# Patient Record
Sex: Male | Born: 1940 | Race: White | Hispanic: No | Marital: Married | Smoking: Never smoker
Health system: Southern US, Community
[De-identification: ages and names within clinical notes are randomized; demographics above are authoritative.]

## PROBLEM LIST (undated history)

## (undated) DIAGNOSIS — I2510 Atherosclerotic heart disease of native coronary artery without angina pectoris: Secondary | ICD-10-CM

## (undated) HISTORY — PX: HIP SURGERY: SHX245

## (undated) HISTORY — PX: AORTIC VALVE REPLACEMENT (AVR)/CORONARY ARTERY BYPASS GRAFTING (CABG): SHX5725

---

## 2015-12-24 ENCOUNTER — Emergency Department (HOSPITAL_COMMUNITY): Payer: Medicare (Managed Care)

## 2015-12-24 ENCOUNTER — Encounter (HOSPITAL_COMMUNITY): Payer: Self-pay | Admitting: Emergency Medicine

## 2015-12-24 DIAGNOSIS — I2510 Atherosclerotic heart disease of native coronary artery without angina pectoris: Secondary | ICD-10-CM | POA: Insufficient documentation

## 2015-12-24 DIAGNOSIS — R0789 Other chest pain: Secondary | ICD-10-CM | POA: Insufficient documentation

## 2015-12-24 DIAGNOSIS — Z951 Presence of aortocoronary bypass graft: Secondary | ICD-10-CM | POA: Diagnosis not present

## 2015-12-24 DIAGNOSIS — M7989 Other specified soft tissue disorders: Secondary | ICD-10-CM | POA: Insufficient documentation

## 2015-12-24 LAB — BASIC METABOLIC PANEL
Anion gap: 7 (ref 5–15)
BUN: 8 mg/dL (ref 6–20)
CALCIUM: 9.1 mg/dL (ref 8.9–10.3)
CHLORIDE: 98 mmol/L — AB (ref 101–111)
CO2: 28 mmol/L (ref 22–32)
CREATININE: 1.09 mg/dL (ref 0.61–1.24)
GFR calc Af Amer: >60 mL/min (ref >60)
GFR calc non Af Amer: >60 mL/min (ref >60)
Glucose, Bld: 112 mg/dL — ABNORMAL HIGH (ref 65–99)
Potassium: 3.7 mmol/L (ref 3.5–5.1)
SODIUM: 133 mmol/L — AB (ref 135–145)

## 2015-12-24 LAB — CBC
HCT: 39.7 % (ref 39.0–52.0)
Hemoglobin: 12.8 g/dL — ABNORMAL LOW (ref 13.0–17.0)
MCH: 29.6 pg (ref 26.0–34.0)
MCHC: 32.2 g/dL (ref 30.0–36.0)
MCV: 91.7 fL (ref 78.0–100.0)
PLATELETS: 182 10*3/uL (ref 150–400)
RBC: 4.33 MIL/uL (ref 4.22–5.81)
RDW: 13.8 % (ref 11.5–15.5)
WBC: 8.2 10*3/uL (ref 4.0–10.5)

## 2015-12-24 LAB — PROTIME-INR
INR: 1.08
Prothrombin Time: 14 seconds (ref 11.4–15.2)

## 2015-12-24 LAB — I-STAT TROPONIN, ED: Troponin i, poc: 0 ng/mL (ref 0.00–0.08)

## 2015-12-24 NOTE — ED Triage Notes (Signed)
Pt. reports intermittent central chest pressure with mild SOB , nausea , lightheaded and diaphoresis onset evening . History of CAD/CABG/Aortic Valve Replacement , [place] at [place] .

## 2015-12-25 ENCOUNTER — Emergency Department (HOSPITAL_COMMUNITY)
Admission: EM | Admit: 2015-12-25 | Discharge: 2015-12-25 | Disposition: A | Discharge status: Home / Self Care | Payer: Medicare (Managed Care) | Attending: Emergency Medicine | Admitting: Emergency Medicine

## 2015-12-25 DIAGNOSIS — R0789 Other chest pain: Secondary | ICD-10-CM

## 2015-12-25 DIAGNOSIS — M25431 Effusion, right wrist: Secondary | ICD-10-CM

## 2015-12-25 DIAGNOSIS — M25531 Pain in right wrist: Secondary | ICD-10-CM

## 2015-12-25 HISTORY — DX: Atherosclerotic heart disease of native coronary artery without angina pectoris: I25.10

## 2015-12-25 LAB — I-STAT TROPONIN, ED: Troponin i, poc: 0 ng/mL (ref 0.00–0.08)

## 2015-12-25 MED ORDER — OXYCODONE-ACETAMINOPHEN 5-325 MG PO TABS
1.0000 | ORAL_TABLET | Freq: Once | ORAL | Status: AC
  Administered 2015-12-25: 1 via ORAL
  Filled 2015-12-25: qty 1

## 2015-12-25 MED ORDER — OXYCODONE-ACETAMINOPHEN 5-325 MG PO TABS: 1.0000 | ORAL_TABLET | ORAL | 0 refills | Status: AC | PRN

## 2015-12-25 MED ORDER — PREDNISONE 50 MG PO TABS: 50.0000 mg | ORAL_TABLET | Freq: Every day | ORAL | 0 refills | Status: AC

## 2015-12-25 MED ORDER — PREDNISONE 20 MG PO TABS
60.0000 mg | ORAL_TABLET | Freq: Once | ORAL | Status: AC
  Administered 2015-12-25: 60 mg via ORAL
  Filled 2015-12-25: qty 3

## 2015-12-25 NOTE — ED Notes (Signed)
Ortho tech at bedside 

## 2015-12-25 NOTE — ED Provider Notes (Signed)
[facility]-EMERGENCY DEPT Provider Note   CSN: [id number] Arrival date & time: 12/24/15  2001  By signing my name below, I, Rosario Adie, attest that this documentation has been prepared under the direction and in the presence of Dione Booze, MD. Electronically Signed: Rosario Adie, ED Scribe. 12/25/15. 1:24 AM.  History   Chief Complaint Chief Complaint  Patient presents with  . Chest Pain    " Chest Pressure"    The history is provided by the patient and the spouse. No language interpreter was used.   HPI Comments: Michael Huff is a 75 y.o. male with a PMHx significant of CAD s/p CABG, who presents to the Emergency Department complaining of intermittent episodes of chest pain described as tightness onset ~6 hours ago. Pt reports that he was driving earlier today when he felt a sudden onset of light-headedness, SOB, and diaphoresis along with his first episode of pain. This episode lasted for ~15 minutes. No pain while in the ED. No precipitating, modifying, or alleviating factors noted. He additionally states that prior to this episode, and intermittently since his episode of pain he has felt a pruritic sensation diffusely across his body that will last for approximately ~15 minutes each time and has a new onset of left wrist swelling/pain this afternoon. He rates his wrist pain as 9/10. Pt is ~7 weeks s/p aortic valve replacement. He is currently taking Plavix and 81mg  Asprin daily. Denies numbness, or any other associated symptoms.   Past Medical History:  Diagnosis Date  . Coronary artery disease    There are no active problems to display for this patient.  Past Surgical History:  Procedure Laterality Date  . AORTIC VALVE REPLACEMENT (AVR)/CORONARY ARTERY BYPASS GRAFTING (CABG)    . HIP SURGERY      Home Medications    Prior to Admission medications   Not on File   Family History No family history on file.  Social History Social History  Substance Use  Topics  . Smoking status: Never Smoker  . Smokeless tobacco: Never Used  . Alcohol use No   Allergies   Review of patient's allergies indicates no known allergies.  Review of Systems Review of Systems  Constitutional: Positive for diaphoresis.  Respiratory: Positive for chest tightness and shortness of breath.   Cardiovascular: Positive for chest pain.  Musculoskeletal: Positive for arthralgias (left wrist).       Positive for left wrist swelling.   Neurological: Positive for light-headedness. Negative for numbness.  All other systems reviewed and are negative.  Physical Exam Updated Vital Signs BP 149/68   Pulse (!) 59   Temp 98 F (36.7 C) (Oral)   Resp 16   Ht 5\' 8"  (1.727 m)   Wt 185 lb (83.9 kg)   SpO2 98%   BMI 28.13 kg/m   Physical Exam  Constitutional: He is oriented to person, place, and time. He appears well-developed and well-nourished.  HENT:  Head: Normocephalic and atraumatic.  Eyes: EOM are normal. Pupils are equal, round, and reactive to light.  Neck: Normal range of motion. Neck supple. No JVD present.  Cardiovascular: Normal rate and regular rhythm.   Murmur heard. 2/6 systolic ejection murmur noted. Prominent aortic valve click on exam.   Pulmonary/Chest: Effort normal and breath sounds normal. He has no wheezes. He has no rales. He exhibits no tenderness.  Sternotomy scar present that is well healed.   Abdominal: Soft. Bowel sounds are normal. He exhibits no distension and no mass. There  is no tenderness.  Musculoskeletal: He exhibits edema and tenderness.  Right wrist w/ mild effusion, moderate TTP, and pain on passive ROM.   Lymphadenopathy:    He has no cervical adenopathy.  Neurological: He is alert and oriented to person, place, and time. No cranial nerve deficit. He exhibits normal muscle tone. Coordination normal.  Skin: Skin is warm and dry. No rash noted.  Psychiatric: He has a normal mood and affect. His behavior is normal. Judgment and  thought content normal.  Nursing note and vitals reviewed.  ED Treatments / Results  DIAGNOSTIC STUDIES: Oxygen Saturation is 98% on RA, normal by my interpretation.   COORDINATION OF CARE: 1:21 AM-Discussed next steps with pt. Pt verbalized understanding and is agreeable with the plan.   Labs (all labs ordered are listed, but only abnormal results are displayed) Labs Reviewed  BASIC METABOLIC PANEL - Abnormal; Notable for the following:       Result Value   Sodium 133 (*)    Chloride 98 (*)    Glucose, Bld 112 (*)    All other components within normal limits  CBC - Abnormal; Notable for the following:    Hemoglobin 12.8 (*)    All other components within normal limits  PROTIME-INR  I-STAT TROPOININ, ED  I-STAT TROPOININ, ED   EKG  EKG Interpretation  Date/Time:  Wednesday December 24 2015 20:07:00 EDT Ventricular Rate:  58 PR Interval:  178 QRS Duration: 102 QT Interval:  490 QTC Calculation: 481 R Axis:   88 Text Interpretation:  Sinus bradycardia Possible Left atrial enlargement Prolonged QT Abnormal ECG No old tracing to compare Confirmed by [facility]  MD, Humna Moorehouse ([id number]) on 12/25/2015 1:05:47 AM      Radiology Dg Chest 2 View  Result Date: 12/24/2015 CLINICAL DATA:  Right upper extremity swelling. Chest pressure. Diaphoresis. Onset today EXAM: CHEST  2 VIEW COMPARISON:  None. FINDINGS: Prior sternotomy and aortic valvuloplasty.  Moderate cardiomegaly. The lungs are clear except for minimal linear basilar scarring or atelectasis. No airspace consolidation. No effusions. Normal pulmonary vasculature. IMPRESSION: Cardiomegaly.  No consolidation.  No effusion.  No evidence of CHF. Electronically Signed   By: Ellery Plunk M.D.   On: 12/24/2015 21:14   Procedures Procedures (including critical care time)  Medications Ordered in ED Medications  predniSONE (DELTASONE) tablet 60 mg (60 mg Oral Given 12/25/15 0138)  oxyCODONE-acetaminophen (PERCOCET/ROXICET) 5-325 MG per  tablet 1 tablet (1 tablet Oral Given 12/25/15 0138)    Initial Impression / Assessment and Plan / ED Course  I have reviewed the triage vital signs and the nursing notes.  Pertinent labs & imaging results that were available during my care of the patient were reviewed by me and considered in my medical decision making (see chart for details).  Clinical Course    Episodes of chest discomfort which could represent angina. He is status post recent bypass surgery. ECG shows no acute changes and initial troponin is negative. Repeat ECG shows no changes. Repeat troponin is pending. Right wrist pain seems likely to be due to gout and he is given dose of prednisone as well as oxycodone have acetaminophen and placed in a wrist splint for comfort. 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 Currently, warfarin is being withheld because of bleeding complications.  Repeat troponin is normal. He is discharged with prescriptions for prednisone and oxycodone have acetaminophen and is referred back to his  primary care physician and cardiologist in [place].  Final Clinical Impressions(s) / ED Diagnoses   Final diagnoses:  Chest discomfort  Pain and swelling of right wrist    New Prescriptions New Prescriptions   OXYCODONE-ACETAMINOPHEN (PERCOCET) 5-325 MG TABLET    Take 1 tablet by mouth every 4 (four) hours as needed for moderate pain.   PREDNISONE (DELTASONE) 50 MG TABLET    Take 1 tablet (50 mg total) by mouth daily.   I personally performed the services described in this documentation, which was scribed in my presence. The recorded information has been reviewed and is accurate.      Dione Boozeavid Journey [place], MD 12/25/15 [phone]

## 2015-12-25 NOTE — ED Notes (Signed)
Glick MD at bedside  

## 2015-12-25 NOTE — ED Notes (Signed)
RN called ortho tech 

## 2015-12-25 NOTE — Progress Notes (Signed)
Orthopedic Tech Progress Note Patient Details:  Michael BumpMelvin Huff 1940/05/17 [id number]  Ortho Devices Type of Ortho Device: Velcro wrist splint Ortho Device/Splint Location: rue Ortho Device/Splint Interventions: Ordered, Application   Trinna PostMartinez, Karmel Patricelli J 12/25/2015, 1:39 AM

## 2017-04-09 IMAGING — DX DG CHEST 2V
2 series · 2 of 2 positions shown · non-contrast
Comparison: None.

CLINICAL DATA: Right upper extremity swelling. Chest pressure.
Diaphoresis. Onset today

EXAM:
CHEST  2 VIEW

[chest pa]
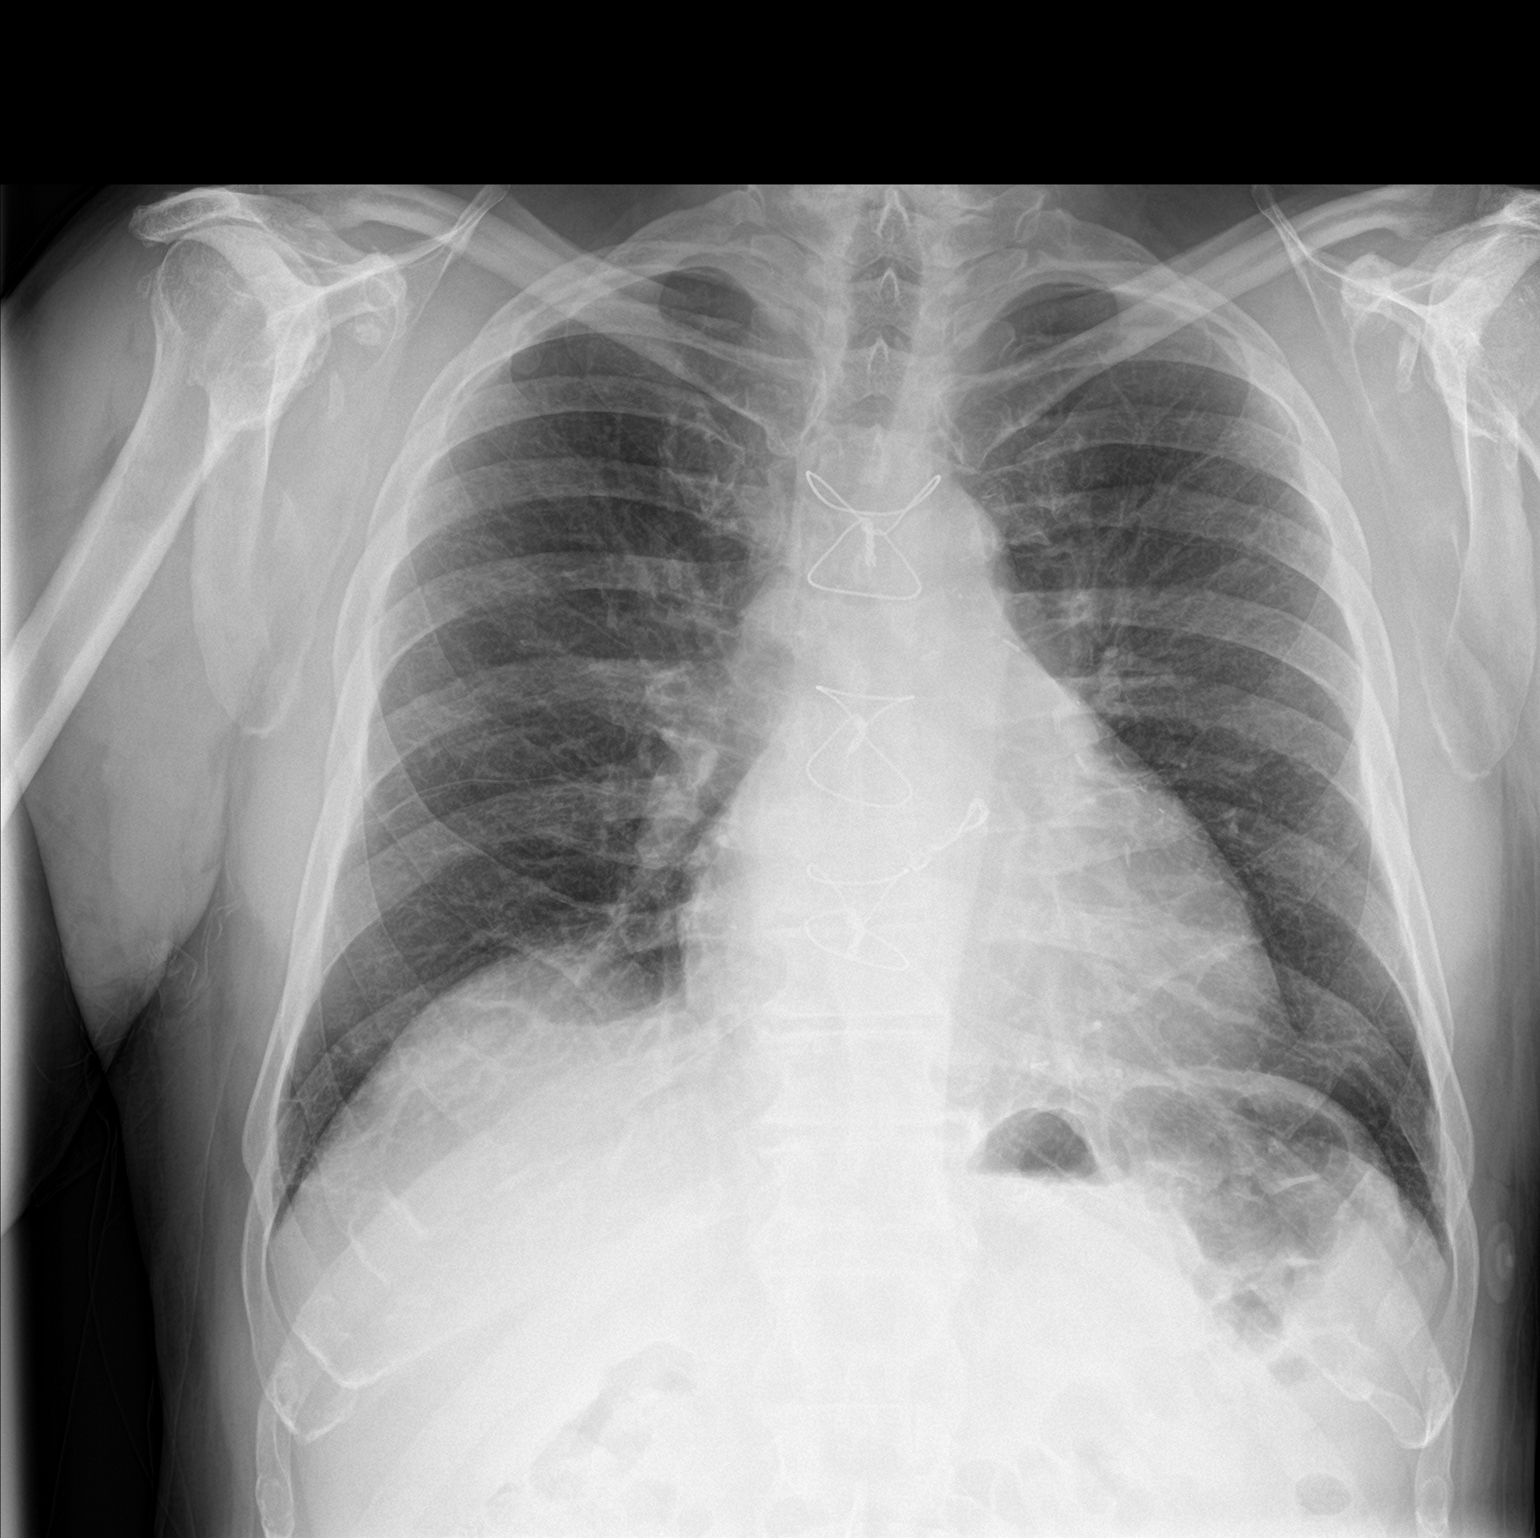

[chest lat]
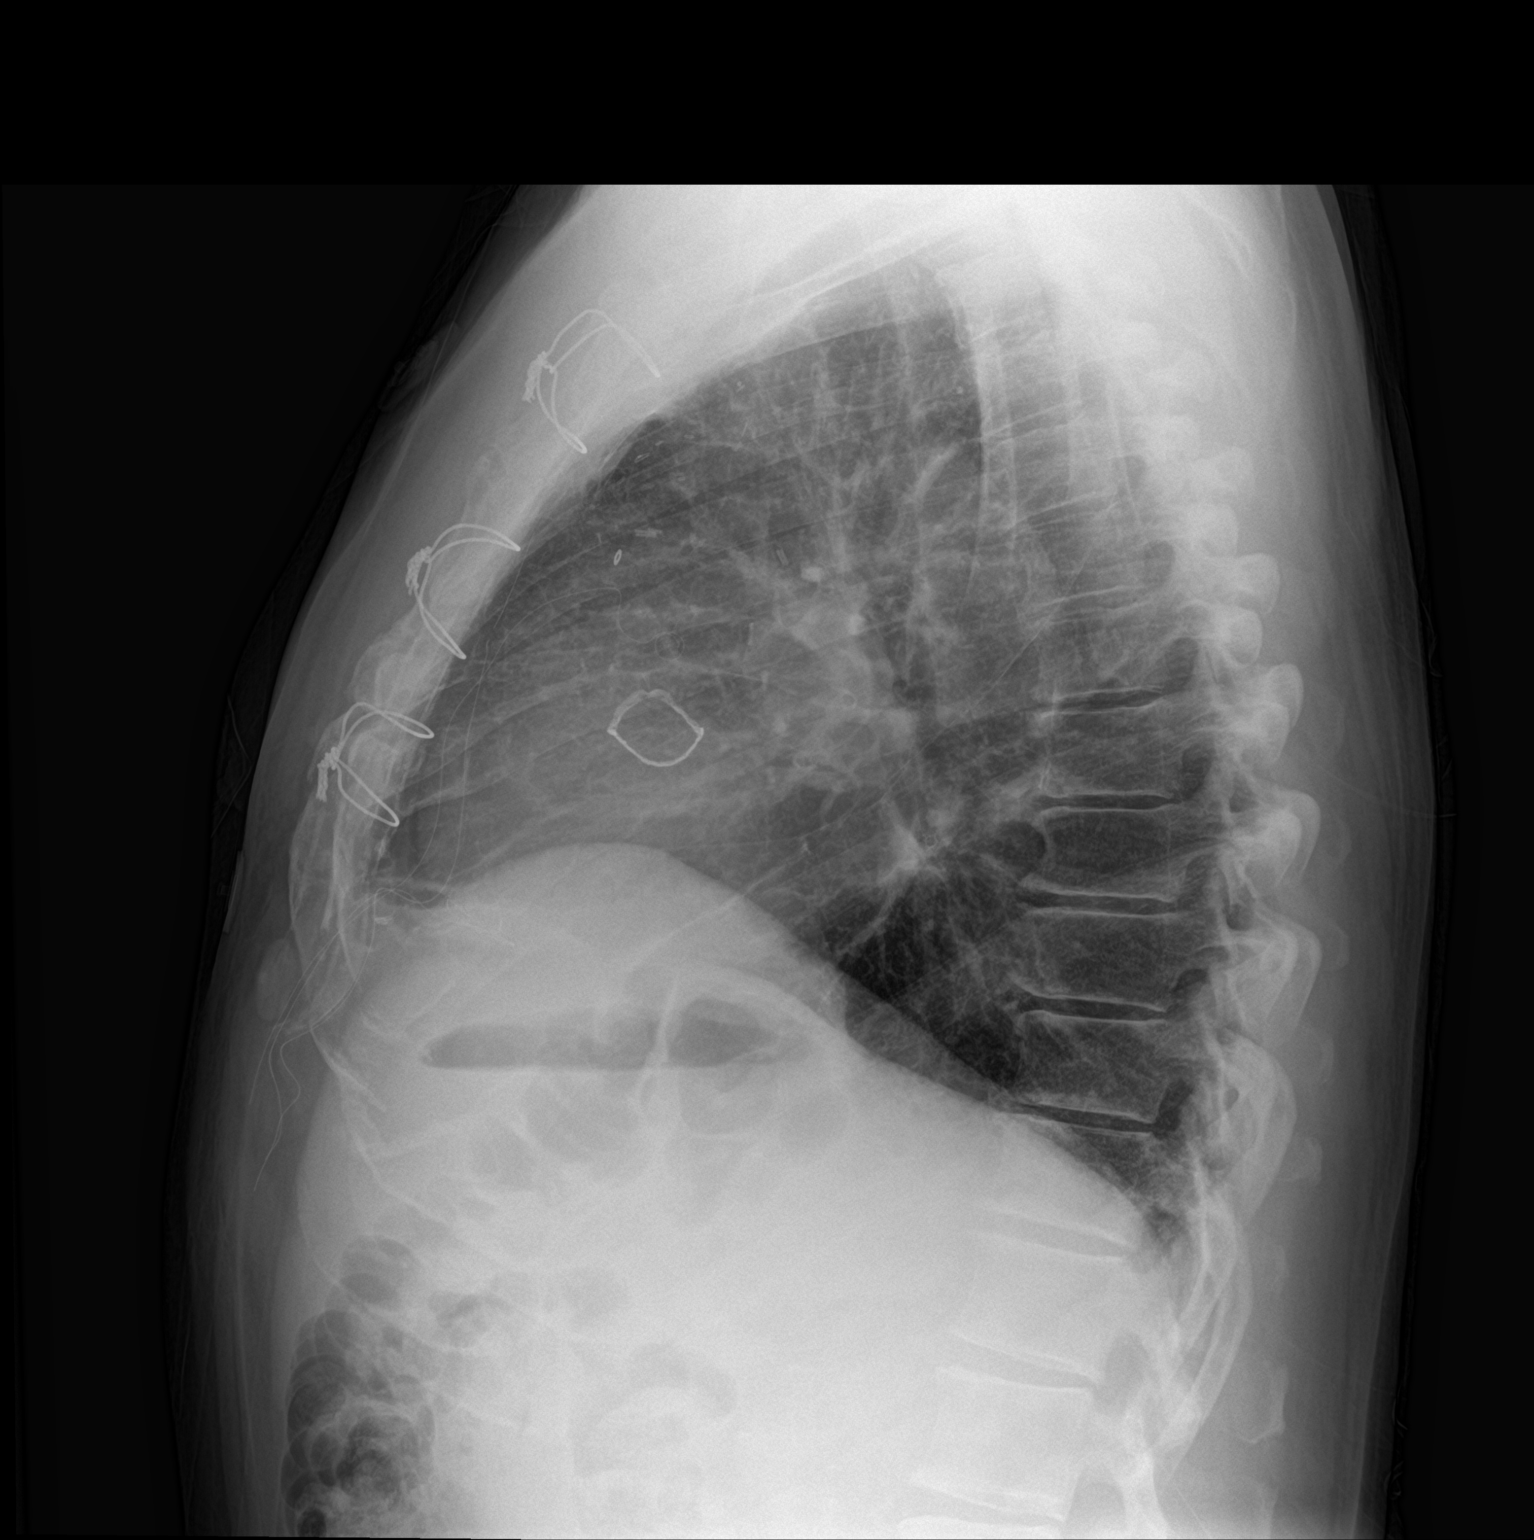

[2 of 2 positions shown; findings below may reference images not displayed]

FINDINGS: Prior sternotomy and aortic valvuloplasty.  Moderate cardiomegaly.

The lungs are clear except for minimal linear basilar scarring or
atelectasis. No airspace consolidation. No effusions. Normal
pulmonary vasculature.
IMPRESSION: Cardiomegaly.  No consolidation.  No effusion.  No evidence of CHF.
